# Patient Record
Sex: Male | Born: 1970 | Race: White | Hispanic: No | Marital: Married | Smoking: Never smoker
Health system: Southern US, Community
[De-identification: ages and names within clinical notes are randomized; demographics above are authoritative.]

## PROBLEM LIST (undated history)

## (undated) DIAGNOSIS — E782 Mixed hyperlipidemia: Secondary | ICD-10-CM

## (undated) DIAGNOSIS — H3320 Serous retinal detachment, unspecified eye: Secondary | ICD-10-CM

## (undated) DIAGNOSIS — I10 Essential (primary) hypertension: Secondary | ICD-10-CM

## (undated) DIAGNOSIS — H509 Unspecified strabismus: Secondary | ICD-10-CM

## (undated) DIAGNOSIS — E669 Obesity, unspecified: Secondary | ICD-10-CM

## (undated) DIAGNOSIS — L723 Sebaceous cyst: Secondary | ICD-10-CM

## (undated) DIAGNOSIS — R7402 Elevation of levels of lactic acid dehydrogenase (LDH): Secondary | ICD-10-CM

## (undated) DIAGNOSIS — R740 Nonspecific elevation of levels of transaminase and lactic acid dehydrogenase [LDH]: Secondary | ICD-10-CM

## (undated) HISTORY — DX: Nonspecific elevation of levels of transaminase and lactic acid dehydrogenase (ldh): R74.0

## (undated) HISTORY — DX: Unspecified strabismus: H50.9

## (undated) HISTORY — DX: Serous retinal detachment, unspecified eye: H33.20

## (undated) HISTORY — DX: Mixed hyperlipidemia: E78.2

## (undated) HISTORY — DX: Essential (primary) hypertension: I10

## (undated) HISTORY — PX: TONSILLECTOMY: SUR1361

## (undated) HISTORY — PX: OTHER SURGICAL HISTORY: SHX169

## (undated) HISTORY — DX: Elevation of levels of lactic acid dehydrogenase (LDH): R74.02

## (undated) HISTORY — PX: EYE SURGERY: SHX253

## (undated) HISTORY — DX: Obesity, unspecified: E66.9

## (undated) HISTORY — DX: Sebaceous cyst: L72.3

---

## 2014-07-30 ENCOUNTER — Ambulatory Visit (INDEPENDENT_AMBULATORY_CARE_PROVIDER_SITE_OTHER): Payer: BC Managed Care – PPO | Admitting: Unknown Physician Specialty

## 2014-07-30 ENCOUNTER — Encounter: Payer: Self-pay | Admitting: Unknown Physician Specialty

## 2014-07-30 VITALS — BP 135/87 | HR 78 | Temp 98.5°F | Ht 70.0 in | Wt 257.0 lb

## 2014-07-30 DIAGNOSIS — J018 Other acute sinusitis: Secondary | ICD-10-CM

## 2014-07-30 MED ORDER — AZITHROMYCIN 250 MG PO TABS: ORAL_TABLET | ORAL | Status: DC

## 2014-07-30 NOTE — Patient Instructions (Signed)
You have a sinus infection. Take medicine as prescribed:  Push fluids and plenty of rest. Nasal saline irrigation or neti pot to help drain sinuses. May use plain mucinex with plenty of fluid to help mobilize mucous. Please let us know if fever >101.5, trouble opening/closing mouth, difficulty swallowing, or worsening instead of improving as expected.

## 2014-07-30 NOTE — Progress Notes (Signed)
   BP 135/87 mmHg  Pulse 78  Temp(Src) 98.5 F (36.9 C) (Oral)  Ht 5\' 10"  (1.778 m)  Wt 257 lb (116.574 kg)  BMI 36.88 kg/m2  SpO2 97%   Subjective:    Patient ID: Derek Garrett, male    DOB: 06/03/1970, 44 y.o.   MRN: [id number]  HPI: Derek Garrett is a 44 y.o. male presenting on 07/30/2014 for Cough and Sinusitis      Relevant past medical, surgical, family and social history reviewed and updated as indicated. Interim medical history since our last visit reviewed. Allergies and medications reviewed and updated.  Cough This is a new problem. The current episode started 1 to 4 weeks ago. The problem has been unchanged. The problem occurs constantly. The cough is productive of purulent sputum. Associated symptoms include postnasal drip and rhinorrhea. Pertinent negatives include no shortness of breath. Nothing aggravates the symptoms. He has tried nothing for the symptoms. His past medical history is significant for asthma. There is no history of COPD.  Sinusitis Associated symptoms include coughing. Pertinent negatives include no shortness of breath.     Review of Systems  HENT: Positive for postnasal drip and rhinorrhea.   Respiratory: Positive for cough. Negative for shortness of breath.     Per HPI unless specifically indicated above     Objective:    BP 135/87 mmHg  Pulse 78  Temp(Src) 98.5 F (36.9 C) (Oral)  Ht 5\' 10"  (1.778 m)  Wt 257 lb (116.574 kg)  BMI 36.88 kg/m2  SpO2 97%  Wt Readings from Last 3 Encounters:  07/30/14 257 lb (116.574 kg)  03/07/14 265 lb (120.203 kg)  07/30/14 265 lb (120.203 kg)    Physical Exam  Constitutional: He is oriented to person, place, and time. He appears well-developed and well-nourished. No distress.  HENT:  Head: Normocephalic and atraumatic.  Right Ear: Tympanic membrane, external ear and ear canal normal.  Left Ear: Tympanic membrane, external ear and ear canal normal.  Nose: Mucosal edema and rhinorrhea present. No sinus  tenderness.  Mouth/Throat: Mucous membranes are normal. Posterior oropharyngeal erythema present. No oropharyngeal exudate or posterior oropharyngeal edema.  Eyes: Conjunctivae and lids are normal. Right eye exhibits no discharge. Left eye exhibits no discharge. No scleral icterus.  Cardiovascular: Normal rate and regular rhythm.   Pulmonary/Chest: Effort normal. No respiratory distress.  Abdominal: Normal appearance and bowel sounds are normal. He exhibits no distension. There is no splenomegaly or hepatomegaly. There is no tenderness.  Musculoskeletal: Normal range of motion.  Neurological: He is alert and oriented to person, place, and time.  Skin: Skin is intact. No rash noted. No pallor.  Psychiatric: He has a normal mood and affect. His behavior is normal. Judgment and thought content normal.       Assessment & Plan:   Problem List Items Addressed This Visit    None    Visit Diagnoses    Other acute sinusitis    -  Primary    Rx for Zithromax,  Pt ed on care.      Relevant Medications    azithromycin (ZITHROMAX Z-PAK) 250 MG tablet        Follow up plan: F/u prn

## 2014-09-12 ENCOUNTER — Ambulatory Visit (INDEPENDENT_AMBULATORY_CARE_PROVIDER_SITE_OTHER): Payer: BC Managed Care – PPO | Admitting: Family Medicine

## 2014-09-12 ENCOUNTER — Encounter: Payer: Self-pay | Admitting: Family Medicine

## 2014-09-12 VITALS — BP 145/93 | HR 71 | Temp 98.3°F | Wt 261.0 lb

## 2014-09-12 DIAGNOSIS — I10 Essential (primary) hypertension: Secondary | ICD-10-CM | POA: Insufficient documentation

## 2014-09-12 DIAGNOSIS — R748 Abnormal levels of other serum enzymes: Secondary | ICD-10-CM | POA: Diagnosis not present

## 2014-09-12 DIAGNOSIS — J0100 Acute maxillary sinusitis, unspecified: Secondary | ICD-10-CM

## 2014-09-12 DIAGNOSIS — E781 Pure hyperglyceridemia: Secondary | ICD-10-CM | POA: Insufficient documentation

## 2014-09-12 DIAGNOSIS — J320 Chronic maxillary sinusitis: Secondary | ICD-10-CM | POA: Insufficient documentation

## 2014-09-12 MED ORDER — AMOXICILLIN-POT CLAVULANATE 875-125 MG PO TABS: 1.0000 | ORAL_TABLET | Freq: Two times a day (BID) | ORAL | Status: DC

## 2014-09-12 NOTE — Assessment & Plan Note (Signed)
Lowfat diet, modest weight loss, recheck labs in about one month

## 2014-09-12 NOTE — Assessment & Plan Note (Signed)
Most likely fatty liver; reviewed with him the results from January and the SGPT is greater than the SGOT, so I don't think alcohol was the culprit; return in one month, modest weight loss, low fat diet

## 2014-09-12 NOTE — Patient Instructions (Addendum)
Okay to return for a lab visit in one month Less saturated fats Start the antibiotics Try vitamin C (orange juice if not diabetic or vitamin C tablets) and drink green tea to help your immune system during your illness Get plenty of rest and hydration Try to lose 5-10 pounds to help lower blood pressure as well  DASH Eating Plan DASH stands for "Dietary Approaches to Stop Hypertension." The DASH eating plan is a healthy eating plan that has been shown to reduce high blood pressure (hypertension). Additional health benefits may include reducing the risk of type 2 diabetes mellitus, heart disease, and stroke. The DASH eating plan may also help with weight loss. WHAT DO I NEED TO KNOW ABOUT THE DASH EATING PLAN? For the DASH eating plan, you will follow these general guidelines:  Choose foods with a percent daily value for sodium of less than 5% (as listed on the food label).  Use salt-free seasonings or herbs instead of table salt or sea salt.  Check with your health care provider or pharmacist before using salt substitutes.  Eat lower-sodium products, often labeled as "lower sodium" or "no salt added."  Eat fresh foods.  Eat more vegetables, fruits, and low-fat dairy products.  Choose whole grains. Look for the word "whole" as the first word in the ingredient list.  Choose fish and skinless chicken or [place] more often than red meat. Limit fish, poultry, and meat to 6 oz (170 g) each day.  Limit sweets, desserts, sugars, and sugary drinks.  Choose heart-healthy fats.  Limit cheese to 1 oz (28 g) per day.  Eat more home-cooked food and less restaurant, buffet, and fast food.  Limit fried foods.  Cook foods using methods other than frying.  Limit canned vegetables. If you do use them, rinse them well to decrease the sodium.  When eating at a restaurant, ask that your food be prepared with less salt, or no salt if possible. WHAT FOODS CAN I EAT? Seek help from a dietitian for  individual calorie needs. Grains Whole grain or whole wheat bread. Brown rice. Whole grain or whole wheat pasta. Quinoa, bulgur, and whole grain cereals. Low-sodium cereals. Corn or whole wheat flour tortillas. Whole grain cornbread. Whole grain crackers. Low-sodium crackers. Vegetables Fresh or frozen vegetables (raw, steamed, roasted, or grilled). Low-sodium or reduced-sodium tomato and vegetable juices. Low-sodium or reduced-sodium tomato sauce and paste. Low-sodium or reduced-sodium canned vegetables.  Fruits All fresh, canned (in natural juice), or frozen fruits. Meat and Other Protein Products Ground beef (85% or leaner), grass-fed beef, or beef trimmed of fat. Skinless chicken or [place]. Ground chicken or [place]. Pork trimmed of fat. All fish and seafood. Eggs. Dried beans, peas, or lentils. Unsalted nuts and seeds. Unsalted canned beans. Dairy Low-fat dairy products, such as skim or 1% milk, 2% or reduced-fat cheeses, low-fat ricotta or cottage cheese, or plain low-fat yogurt. Low-sodium or reduced-sodium cheeses. Fats and Oils Tub margarines without trans fats. Light or reduced-fat mayonnaise and salad dressings (reduced sodium). Avocado. Safflower, olive, or canola oils. Natural peanut or almond butter. Other Unsalted popcorn and pretzels. The items listed above may not be a complete list of recommended foods or beverages. Contact your dietitian for more options. WHAT FOODS ARE NOT RECOMMENDED? Grains White bread. White pasta. White rice. Refined cornbread. Bagels and croissants. Crackers that contain trans fat. Vegetables Creamed or fried vegetables. Vegetables in a cheese sauce. Regular canned vegetables. Regular canned tomato sauce and paste. Regular tomato and vegetable juices. Fruits  Dried fruits. Canned fruit in light or heavy syrup. Fruit juice. Meat and Other Protein Products Fatty cuts of meat. Ribs, chicken wings, bacon, sausage, bologna, salami, chitterlings, fatback, hot  dogs, bratwurst, and packaged luncheon meats. Salted nuts and seeds. Canned beans with salt. Dairy Whole or 2% milk, cream, half-and-half, and cream cheese. Whole-fat or sweetened yogurt. Full-fat cheeses or blue cheese. Nondairy creamers and whipped toppings. Processed cheese, cheese spreads, or cheese curds. Condiments Onion and garlic salt, seasoned salt, table salt, and sea salt. Canned and packaged gravies. Worcestershire sauce. Tartar sauce. Barbecue sauce. Teriyaki sauce. Soy sauce, including reduced sodium. Steak sauce. Fish sauce. Oyster sauce. Cocktail sauce. Horseradish. Ketchup and mustard. Meat flavorings and tenderizers. Bouillon cubes. Hot sauce. Tabasco sauce. Marinades. Taco seasonings. Relishes. Fats and Oils Butter, stick margarine, lard, shortening, ghee, and bacon fat. Coconut, palm kernel, or palm oils. Regular salad dressings. Other Pickles and olives. Salted popcorn and pretzels. The items listed above may not be a complete list of foods and beverages to avoid. Contact your dietitian for more information. WHERE CAN I FIND MORE INFORMATION? National Heart, Lung, and Blood Institute: [employer] Document Released: 01/29/2011 Document Revised: 06/26/2013 Document Reviewed: 12/14/2012 [facility] Patient Information 2015 [place], [place]. This information is not intended to replace advice given to you by your health care provider. Make sure you discuss any questions you have with your health care provider.

## 2014-09-12 NOTE — Assessment & Plan Note (Addendum)
Patient is not taking ACE-I any more; he would rather not use any medicine; he will try the DASH guidelines and try to lose a little more weight

## 2014-09-12 NOTE — Progress Notes (Signed)
BP 145/93 mmHg  Pulse 71  Temp(Src) 98.3 F (36.8 C)  Wt 261 lb (118.389 kg)  SpO2 99%   Subjective:    Patient ID: Derek Garrett, male    DOB: 1970/11/11, 44 y.o.   MRN: [id number]  HPI: Derek Garrett is a 44 y.o. male  Chief Complaint  Patient presents with  . Letter for School/Work    FMLA forms  . Sinus Problem  Patient is here for FMLA paperwork; however, I noted his BP was up He is usually under 140 [place], usually under 90 on the bottom; he has switched schedule but had to work a night shift last night; drank coffee and ate through the night; overall, BP has been better; had labs done through Dr. Dossie Arbour; today's reading is abnormal for him recently; not taking  Tylenol cold and sinus right now [facility] and was put on a Zpak couple of months ago; still having ear problems, nasal congestion; wakes up with a cough; has postnasal drainage; thought maybe allergies; having sinus / facial pressure; no dental pain, no fevers; drainage is yellow  Relevant past medical, surgical, family and social history reviewed and updated as indicated. Interim medical history since our last visit reviewed. Allergies and medications reviewed and updated.  Review of Systems Per HPI unless specifically indicated above     Objective:    BP 145/93 mmHg  Pulse 71  Temp(Src) 98.3 F (36.8 C)  Wt 261 lb (118.389 kg)  SpO2 99%  Wt Readings from Last 3 Encounters:  09/12/14 261 lb (118.389 kg)  07/30/14 257 lb (116.574 kg)  03/07/14 265 lb (120.203 kg)    Physical Exam  Constitutional: He appears well-developed and well-nourished. No distress.  HENT:  Right Ear: Hearing, tympanic membrane, external ear and ear canal normal.  Left Ear: Hearing, tympanic membrane, external ear and ear canal normal.  Nose: Rhinorrhea (yellowish rhinorrhea, right worse than left; mucosa is erythematous) present. No septal deviation. No epistaxis. Right sinus exhibits maxillary sinus tenderness and frontal sinus  tenderness. Left sinus exhibits frontal sinus tenderness. Left sinus exhibits no maxillary sinus tenderness.  Mouth/Throat: Oropharynx is clear and moist and mucous membranes are normal. No oropharyngeal exudate, posterior oropharyngeal edema or posterior oropharyngeal erythema.  Cardiovascular: Normal rate and regular rhythm.   Pulmonary/Chest: Effort normal and breath sounds normal.  Lymphadenopathy:    He has no cervical adenopathy.  Skin: Skin is warm and dry. He is not diaphoretic. No pallor.    No results found for this or any previous visit.    Assessment & Plan:   Problem List Items Addressed This Visit      Cardiovascular and Mediastinum   Benign hypertension - Primary    Patient is not taking ACE-I any more; he would rather not use any medicine; he will try the DASH guidelines and try to lose a little more weight        Respiratory   Maxillary sinusitis    Rest, hydration, vitamin C; start amox/clav for 10 days      Relevant Medications   amoxicillin-clavulanate (AUGMENTIN) 875-125 MG per tablet     Other   Elevated liver enzymes    Most likely fatty liver; reviewed with him the results from January and the SGPT is greater than the SGOT, so I don't think alcohol was the culprit; return in one month, modest weight loss, low fat diet      Relevant Orders   Comprehensive metabolic panel   Hypertriglyceridemia  Lowfat diet, modest weight loss, recheck labs in about one month      Relevant Orders   Lipid Panel w/o Chol/HDL Ratio      FMLA paperwork filled out for patient (son has autism)  Follow up plan: No Follow-up on file.  Orders Placed This Encounter  Procedures  . Lipid Panel w/o Chol/HDL Ratio  . Comprehensive metabolic panel   Meds ordered this encounter  Medications  . amoxicillin-clavulanate (AUGMENTIN) 875-125 MG per tablet    Sig: Take 1 tablet by mouth 2 (two) times daily.    Dispense:  20 tablet    Refill:  0

## 2014-09-12 NOTE — Assessment & Plan Note (Signed)
Rest, hydration, vitamin C; start amox/clav for 10 days

## 2014-10-16 ENCOUNTER — Other Ambulatory Visit: Payer: BC Managed Care – PPO

## 2015-01-15 ENCOUNTER — Telehealth: Payer: Self-pay

## 2015-01-15 NOTE — Telephone Encounter (Signed)
-----   Message from Elby ShowersAmy J Workman, [place] sent at 01/15/2015 11:29 AM EST ----- Regarding: FW: Please remind patient we would like fasting labs done soon Left message to call ----- Message -----    From: Kerman PasseyMelinda P Lada, MD    Sent: 01/12/2015   5:38 PM      To: Elby ShowersAmy J Workman, CMA Subject: Please remind patient we would like fasting #  This is usually best to get done BEFORE the holidays If he can come fasting Tuesday or Wednesday, that would be ideal If not, the week after Thanksgiving please

## 2015-01-15 NOTE — Telephone Encounter (Signed)
Patients wife notified

## 2015-03-13 ENCOUNTER — Encounter: Payer: Self-pay | Admitting: Family Medicine

## 2015-03-13 ENCOUNTER — Ambulatory Visit (INDEPENDENT_AMBULATORY_CARE_PROVIDER_SITE_OTHER): Payer: BC Managed Care – PPO | Admitting: Family Medicine

## 2015-03-13 VITALS — BP 142/86 | HR 80 | Temp 97.0°F | Ht 69.5 in | Wt 262.0 lb

## 2015-03-13 DIAGNOSIS — I10 Essential (primary) hypertension: Secondary | ICD-10-CM | POA: Diagnosis not present

## 2015-03-13 DIAGNOSIS — E781 Pure hyperglyceridemia: Secondary | ICD-10-CM

## 2015-03-13 DIAGNOSIS — R748 Abnormal levels of other serum enzymes: Secondary | ICD-10-CM

## 2015-03-13 DIAGNOSIS — L723 Sebaceous cyst: Secondary | ICD-10-CM

## 2015-03-13 NOTE — Patient Instructions (Addendum)
Try to limit saturated fats in your diet (bologna, hot dogs, barbeque, cheeseburgers, hamburgers, steak, bacon, sausage, cheese, etc.) and get more fresh fruits, vegetables, and whole grains Limit eggs to no more than three per week Work [employer] weight loss Check out the information at familydoctor.org entitled "What It Takes to Lose Weight" Try to lose between 1-2 pounds per week by taking in fewer calories and burning off more calories You can succeed by limiting portions, limiting foods dense in calories and fat, becoming more active, and drinking 8 glasses of water a day (64 ounces) Don't skip meals, especially breakfast, as skipping meals may alter your metabolism Do not use over-the-counter weight loss pills or gimmicks that claim rapid weight loss A healthy BMI (or body mass index) is between 18.5 and 24.9 You can calculate your ideal BMI at the NIH website [employer] You can have that place on your scalp removed if you desire

## 2015-03-13 NOTE — Assessment & Plan Note (Addendum)
Recheck today; limit alcohol to 14 drinks per week (call me if any trouble doing so); limit tylenol

## 2015-03-13 NOTE — Assessment & Plan Note (Addendum)
Check today (fasting); limiting fried foods, sweets; modest weight loss encouraged

## 2015-03-13 NOTE — Assessment & Plan Note (Signed)
He is welcome to return to have this removed

## 2015-03-13 NOTE — Progress Notes (Signed)
BP 142/86 mmHg  Pulse 80  Temp(Src) 97 F (36.1 C)  Ht 5' 9.5" (1.765 m)  Wt 262 lb (118.842 kg)  BMI 38.15 kg/m2  SpO2 95%   Subjective:    Patient ID: Derek Garrett, male    DOB: 1971-01-20, 45 y.o.   MRN: [id number]  HPI: Derek Garrett is a 45 y.o. male  Chief Complaint  Patient presents with  . FMLA forms    he needs his FMLA paperwork updated  . Hypertension    follow up  . Hyperlipidemia    follow up and labs   Patient is here for f/u of HTN, cholesterol, and needs forms filled out  He did come fasting today Never been on medicine for high cholesterol His father was on cholesterol medicine, not sure about mother, maybe so Father had bypass surgery Typical American eater in terms of meats; not much fried foods; more than three eggs a week; eats cheese; drinks skim milk  High blood pressure; mother had high blood pressure; he does check BP at work, usually below 140 on top, below 90 on the bottom; does not add salt to food much, just when cooking; not at the table; pays attention to the labels; has the DASH list and pays attention since getting that info; tries to limit sugar and salt intake; eats pasta smart and whole grains; he would like to control it without pills  He has hx of elevated liver enzymes; not much tylenol; usually ibuprofen for issues; does drink 2-3 beers a day  Flu shot UTD  Little bump; been there for years, right side scalp; doesn't drain  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 Interim medical history since our last visit reviewed. He drinks 2-3 beers per day Allergies and medications reviewed and updated.  Review of Systems  Respiratory: Negative for shortness of breath.   Cardiovascular: Negative for chest pain.   Per HPI unless specifically indicated above     Objective:    BP 142/86 mmHg  Pulse 80  Temp(Src) 97 F  (36.1 C)  Ht 5' 9.5" (1.765 m)  Wt 262 lb (118.842 kg)  BMI 38.15 kg/m2  SpO2 95%  Wt Readings from Last 3 Encounters:  03/13/15 262 lb (118.842 kg)  09/12/14 261 lb (118.389 kg)  07/30/14 257 lb (116.574 kg)    Physical Exam  Constitutional: He appears well-developed and well-nourished. No distress.  HENT:  Head: Normocephalic and atraumatic.  Eyes: EOM are normal. No scleral icterus.  Neck: No thyromegaly present.  Cardiovascular: Normal rate and regular rhythm.   Pulmonary/Chest: Effort normal and breath sounds normal.  Abdominal: Soft. Bowel sounds are normal. He exhibits no distension.  Musculoskeletal: He exhibits no edema.  Neurological: Coordination normal.  Skin: Skin is warm and dry. No pallor.  Psychiatric: He has a normal mood and affect. His behavior is normal. Judgment and thought content normal.    No results found for this or any previous visit.    Assessment & Plan:   Problem List Items Addressed This Visit      Cardiovascular and Mediastinum   Benign hypertension - Primary    Watch DASH guidelines, limit salt, limit alcohol (no more than 2 per day, call me if trouble cutting back); modest weight loss; monitor and let me know if over 140/90; he does not want medicine at this time  Musculoskeletal and Integument   Sebaceous cyst    He is welcome to return to have this removed        Other   Elevated liver enzymes    Recheck today; limit alcohol to 14 drinks per week (call me if any trouble doing so); limit tylenol      Relevant Orders   Comprehensive metabolic panel   Hypertriglyceridemia    Check today (fasting); limiting fried foods, sweets; modest weight loss encouraged      Relevant Orders   Lipid Panel w/o Chol/HDL Ratio      Follow up plan: Return in about 6 months (around 09/10/2015) for thirty minute follow-up with fasting labs.  Orders Placed This Encounter  Procedures  . Lipid Panel w/o Chol/HDL Ratio  . Comprehensive  metabolic panel   An after-visit summary was printed and given to the patient at check-out.  Please see the patient instructions which may contain other information and recommendations beyond what is mentioned above in the assessment and plan.  FMLA form completed, see copy on the chart

## 2015-03-13 NOTE — Assessment & Plan Note (Addendum)
Watch DASH guidelines, limit salt, limit alcohol (no more than 2 per day, call me if trouble cutting back); modest weight loss; monitor and let me know if over 140/90; he does not want medicine at this time

## 2015-03-14 LAB — COMPREHENSIVE METABOLIC PANEL
ALBUMIN: 4.6 g/dL (ref 3.5–5.5)
ALT: 79 IU/L — ABNORMAL HIGH (ref 0–44)
AST: 43 IU/L — ABNORMAL HIGH (ref 0–40)
Albumin/Globulin Ratio: 2.1 (ref 1.1–2.5)
Alkaline Phosphatase: 50 IU/L (ref 39–117)
BUN / CREAT RATIO: 16 (ref 9–20)
BUN: 14 mg/dL (ref 6–24)
Bilirubin Total: 0.6 mg/dL (ref 0.0–1.2)
CO2: 23 mmol/L (ref 18–29)
CREATININE: 0.89 mg/dL (ref 0.76–1.27)
Calcium: 9.6 mg/dL (ref 8.7–10.2)
Chloride: 99 mmol/L (ref 96–106)
GFR, EST AFRICAN AMERICAN: 119 mL/min/{1.73_m2} (ref >59)
GFR, EST NON AFRICAN AMERICAN: 103 mL/min/{1.73_m2} (ref >59)
GLOBULIN, TOTAL: 2.2 g/dL (ref 1.5–4.5)
GLUCOSE: 97 mg/dL (ref 65–99)
Potassium: 4.4 mmol/L (ref 3.5–5.2)
SODIUM: 140 mmol/L (ref 134–144)
Total Protein: 6.8 g/dL (ref 6.0–8.5)

## 2015-03-14 LAB — LIPID PANEL W/O CHOL/HDL RATIO
Cholesterol, Total: 177 mg/dL (ref 100–199)
HDL: 40 mg/dL (ref >39)
LDL CALC: 100 mg/dL — AB (ref 0–99)
TRIGLYCERIDES: 186 mg/dL — AB (ref 0–149)
VLDL Cholesterol Cal: 37 mg/dL (ref 5–40)

## 2015-03-19 ENCOUNTER — Telehealth: Payer: Self-pay | Admitting: Family Medicine

## 2015-03-19 DIAGNOSIS — R748 Abnormal levels of other serum enzymes: Secondary | ICD-10-CM

## 2015-03-19 NOTE — Telephone Encounter (Signed)
That was done the day he was here; I'll call because there must be a question about something, but it was finished while he was here in the office I'll also call about labs; lipid panel, elevated SGOT and SGPT AST 0 - 40 IU/L 43 (H)   ALT 0 - 44 IU/L 79 (H)

## 2015-03-19 NOTE — Telephone Encounter (Signed)
Pt would like a call back in regards to his fmla paperwork.

## 2015-03-20 NOTE — Assessment & Plan Note (Signed)
Will get liver US; try weight loss, low-fat diet, decrease EtOH; recheck LFTs in 3 months

## 2015-03-20 NOTE — Telephone Encounter (Signed)
Spoke with patient, forms are ready (he added something and I just needed to initial it) Explained lipid panel; elevated LFTs, likely fatty liver; will get [place], try low-fat diet, weight loss, reducing EtOH, recheck liver enzymes in 3 months; he agrees Monday through Thursday, mornings are best for [place]

## 2015-04-01 ENCOUNTER — Ambulatory Visit
Admission: RE | Admit: 2015-04-01 | Discharge: 2015-04-01 | Disposition: A | Discharge status: Home / Self Care | Payer: BC Managed Care – PPO | Source: Ambulatory Visit | Attending: Family Medicine | Admitting: Family Medicine

## 2015-04-01 DIAGNOSIS — K760 Fatty (change of) liver, not elsewhere classified: Secondary | ICD-10-CM | POA: Insufficient documentation

## 2015-04-01 DIAGNOSIS — R748 Abnormal levels of other serum enzymes: Secondary | ICD-10-CM | POA: Diagnosis not present

## 2015-04-02 ENCOUNTER — Telehealth: Payer: Self-pay | Admitting: Family Medicine

## 2015-04-02 DIAGNOSIS — K760 Fatty (change of) liver, not elsewhere classified: Secondary | ICD-10-CM | POA: Insufficient documentation

## 2015-04-02 NOTE — Telephone Encounter (Signed)
I spoke with patient; explained fatty liver; work on weight loss, low fat diet; recheck labs in a few months; he can consider milk thistle

## 2015-05-14 ENCOUNTER — Telehealth: Payer: Self-pay | Admitting: Family Medicine

## 2015-05-14 MED ORDER — OSELTAMIVIR PHOSPHATE 75 MG PO CAPS: 75.0000 mg | ORAL_CAPSULE | Freq: Every day | ORAL | Status: DC

## 2015-05-14 NOTE — Telephone Encounter (Signed)
Sons tested positive for influenza B. Needs prophylactic medicine. Rx sent to his pharmacy.

## 2015-09-10 ENCOUNTER — Ambulatory Visit: Payer: BC Managed Care – PPO | Admitting: Family Medicine

## 2015-09-24 ENCOUNTER — Ambulatory Visit (INDEPENDENT_AMBULATORY_CARE_PROVIDER_SITE_OTHER): Payer: BC Managed Care – PPO | Admitting: Family Medicine

## 2015-09-24 ENCOUNTER — Encounter: Payer: Self-pay | Admitting: Family Medicine

## 2015-09-24 DIAGNOSIS — E781 Pure hyperglyceridemia: Secondary | ICD-10-CM | POA: Diagnosis not present

## 2015-09-24 DIAGNOSIS — K760 Fatty (change of) liver, not elsewhere classified: Secondary | ICD-10-CM | POA: Diagnosis not present

## 2015-09-24 LAB — COMPREHENSIVE METABOLIC PANEL
ALT: 37 U/L (ref 9–46)
AST: 30 U/L (ref 10–40)
Albumin: 4.5 g/dL (ref 3.6–5.1)
Alkaline Phosphatase: 49 U/L (ref 40–115)
BILIRUBIN TOTAL: 0.7 mg/dL (ref 0.2–1.2)
BUN: 14 mg/dL (ref 7–25)
CO2: 27 mmol/L (ref 20–31)
CREATININE: 0.96 mg/dL (ref 0.60–1.35)
Calcium: 9.5 mg/dL (ref 8.6–10.3)
Chloride: 102 mmol/L (ref 98–110)
GLUCOSE: 96 mg/dL (ref 65–99)
Potassium: 4.7 mmol/L (ref 3.5–5.3)
SODIUM: 138 mmol/L (ref 135–146)
Total Protein: 7.3 g/dL (ref 6.1–8.1)

## 2015-09-24 LAB — LIPID PANEL
Cholesterol: 156 mg/dL (ref 125–200)
HDL: 48 mg/dL (ref >=40)
LDL CALC: 86 mg/dL (ref <130)
Total CHOL/HDL Ratio: 3.3 Ratio (ref <=5.0)
Triglycerides: 109 mg/dL (ref <150)
VLDL: 22 mg/dL (ref <30)

## 2015-09-24 NOTE — Progress Notes (Signed)
BP 124/84   Pulse 73   Temp 98.3 F (36.8 C) (Oral)   Resp 16   Wt 232 lb (105.2 kg)   SpO2 97%   BMI 33.77 kg/m    Subjective:    Patient ID: Derek Garrett, male    DOB: 1970-09-24, 45 y.o.   MRN: [id number]  HPI: Derek Garrett is a 45 y.o. male  Chief Complaint  Patient presents with  . Follow-up    He has been working hard at losing weight; exercising and eating better; totally cut out beef and pork; lots of fish and chicken and veggies; lots of veggies in the sauce  He is exercising; no chest pain; not taking aspirin, just occasional advil or tylenol for aches and pains  Fatty liver, elevated LFTs previously; again, he has been working hard to lose weight and eat better  Depression screen PHQ 2/9 09/24/2015  Decreased Interest 0  Down, Depressed, Hopeless 0  PHQ - 2 Score 0   Relevant past medical, surgical, family and social history reviewed Past Medical History:  Diagnosis Date  . Detached retina    Right   . Elevated levels of transaminase & lactic acid dehydrogenase   . Elevated triglycerides with high cholesterol   . Hypertension   . Obesity   . Sebaceous cyst    Scalp  . Strabismus    Right Traumtic strabismus with IOL    Past Surgical History:  Procedure Laterality Date  . cataract surgery    . EYE SURGERY     Right eye retina repair, replaced lens   . TONSILLECTOMY     Family History  Problem Relation Age of Onset  . Hypertension Mother   . Hyperlipidemia Mother   . Stroke Mother   . CAD Father   . Hypercholesterolemia Father   . Heart disease Father   . Diabetes Sister    Social History  Substance Use Topics  . Smoking status: Never Smoker  . Smokeless tobacco: Never Used  . Alcohol use 0.0 oz/week   Interim medical history since last visit reviewed. Allergies and medications reviewed  Review of Systems Per HPI unless specifically indicated above     Objective:    BP 124/84   Pulse 73   Temp 98.3 F (36.8 C) (Oral)   Resp 16    Wt 232 lb (105.2 kg)   SpO2 97%   BMI 33.77 kg/m   Wt Readings from Last 3 Encounters:  09/24/15 232 lb (105.2 kg)  03/13/15 262 lb (118.8 kg)  09/12/14 261 lb (118.4 kg)    Physical Exam  Constitutional: He appears well-developed and well-nourished. No distress.  Weight loss of 30 pounds over last 5-1/2 months  Eyes: No scleral icterus.  Cardiovascular: Normal rate and regular rhythm.   Pulmonary/Chest: Effort normal and breath sounds normal.  Abdominal: Soft. Bowel sounds are normal. He exhibits no distension.  Musculoskeletal: He exhibits no edema.  Neurological: He is alert.  Skin: Skin is warm and dry. No pallor.  Psychiatric: He has a normal mood and affect. His behavior is normal. Judgment and thought content normal.   Results for orders placed or performed in visit on 03/13/15  Lipid Panel w/o Chol/HDL Ratio  Result Value Ref Range   Cholesterol, Total 177 100 - 199 mg/dL   Triglycerides 416 (H) 0 - 149 mg/dL   HDL 40 >38 mg/dL   VLDL Cholesterol Cal 37 5 - 40 mg/dL   LDL Calculated 453 (H) 0 -  99 mg/dL  Comprehensive metabolic panel  Result Value Ref Range   Glucose 97 65 - 99 mg/dL   BUN 14 6 - 24 mg/dL   Creatinine, Ser 1.61 0.76 - 1.27 mg/dL   GFR calc non Af Amer 103 >59 mL/min/1.73   GFR calc Af Amer 119 >59 mL/min/1.73   BUN/Creatinine Ratio 16 9 - 20   Sodium 140 134 - 144 mmol/L   Potassium 4.4 3.5 - 5.2 mmol/L   Chloride 99 96 - 106 mmol/L   CO2 23 18 - 29 mmol/L   Calcium 9.6 8.7 - 10.2 mg/dL   Total Protein 6.8 6.0 - 8.5 g/dL   Albumin 4.6 3.5 - 5.5 g/dL   Globulin, Total 2.2 1.5 - 4.5 g/dL   Albumin/Globulin Ratio 2.1 1.1 - 2.5   Bilirubin Total 0.6 0.0 - 1.2 mg/dL   Alkaline Phosphatase 50 39 - 117 IU/L   AST 43 (H) 0 - 40 IU/L   ALT 79 (H) 0 - 44 IU/L      Assessment & Plan:   Problem List Items Addressed This Visit      Digestive   Fatty liver    I cannot wait to see labs results because I am confident that these numbers will be better  after his hard fought battle with weight loss      Relevant Orders   Comprehensive Metabolic Panel (CMET)     Other   Hypertriglyceridemia    Amazing success with weight loss; check labs and expect improvement      Relevant Orders   Lipid panel    Other Visit Diagnoses   None.     Follow up plan: Return in about 1 year (around 09/23/2016) for complete physical.  An after-visit summary was printed and given to the patient at check-out.  Please see the patient instructions which may contain other information and recommendations beyond what is mentioned above in the assessment and plan.  No orders of the defined types were placed in this encounter.   Orders Placed This Encounter  Procedures  . Comprehensive Metabolic Panel (CMET)  . Lipid panel

## 2015-09-24 NOTE — Assessment & Plan Note (Signed)
I cannot wait to see labs results because I am confident that these numbers will be better after his hard fought battle with weight loss

## 2015-09-24 NOTE — Assessment & Plan Note (Signed)
Amazing success with weight loss; check labs and expect improvement

## 2015-09-24 NOTE — Patient Instructions (Signed)
I am so proud of the changes you have made with exercise and eating better and weight loss We'll contact you about the labs

## 2015-11-21 ENCOUNTER — Ambulatory Visit (INDEPENDENT_AMBULATORY_CARE_PROVIDER_SITE_OTHER): Payer: BC Managed Care – PPO

## 2015-11-21 DIAGNOSIS — Z23 Encounter for immunization: Secondary | ICD-10-CM | POA: Diagnosis not present

## 2016-04-21 ENCOUNTER — Other Ambulatory Visit: Payer: Self-pay | Admitting: Student

## 2016-04-21 DIAGNOSIS — M25512 Pain in left shoulder: Principal | ICD-10-CM

## 2016-04-21 DIAGNOSIS — M19012 Primary osteoarthritis, left shoulder: Secondary | ICD-10-CM

## 2016-04-21 DIAGNOSIS — G8929 Other chronic pain: Secondary | ICD-10-CM

## 2016-05-04 ENCOUNTER — Ambulatory Visit: Payer: BLUE CROSS/BLUE SHIELD

## 2016-09-25 ENCOUNTER — Encounter: Payer: BC Managed Care – PPO | Admitting: Family Medicine

## 2016-10-16 ENCOUNTER — Encounter: Payer: Self-pay | Admitting: Family Medicine

## 2016-10-16 ENCOUNTER — Ambulatory Visit (INDEPENDENT_AMBULATORY_CARE_PROVIDER_SITE_OTHER): Payer: BLUE CROSS/BLUE SHIELD | Admitting: Family Medicine

## 2016-10-16 DIAGNOSIS — Z0000 Encounter for general adult medical examination without abnormal findings: Secondary | ICD-10-CM | POA: Diagnosis not present

## 2016-10-16 LAB — CBC WITH DIFFERENTIAL/PLATELET
BASOS ABS: 62 {cells}/uL (ref 0–200)
Basophils Relative: 1 %
EOS ABS: 186 {cells}/uL (ref 15–500)
EOS PCT: 3 %
HCT: 48.5 % (ref 38.5–50.0)
Hemoglobin: 16.9 g/dL (ref 13.2–17.1)
LYMPHS PCT: 19 %
Lymphs Abs: 1178 cells/uL (ref 850–3900)
MCH: 32.9 pg (ref 27.0–33.0)
MCHC: 34.8 g/dL (ref 32.0–36.0)
MCV: 94.4 fL (ref 80.0–100.0)
MONOS PCT: 8 %
MPV: 9.9 fL (ref 7.5–12.5)
Monocytes Absolute: 496 cells/uL (ref 200–950)
NEUTROS PCT: 69 %
Neutro Abs: 4278 cells/uL (ref 1500–7800)
PLATELETS: 247 10*3/uL (ref 140–400)
RBC: 5.14 MIL/uL (ref 4.20–5.80)
RDW: 12.9 % (ref 11.0–15.0)
WBC: 6.2 10*3/uL (ref 3.8–10.8)

## 2016-10-16 NOTE — Progress Notes (Signed)
Patient ID: Derek Garrett, male   DOB: 06-06-1970, 46 y.o.   MRN: [id number]   Subjective:   Derek Garrett is a 46 y.o. 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; kept the boys safe, minimized sharing of food; totally went away on Monday (today is Friday); no travel; he also had a flap lesion left middle finger about a month ago; [place]head; clean; no red streaks; cleaned it well and has used bandaids on it since  USPSTF grade A and B recommendations Depression:  Depression screen [facility] 2/9 10/16/2016 09/24/2015  Decreased Interest 0 0  Down, Depressed, Hopeless 0 0  PHQ - 2 Score 0 0   Hypertension: BP Readings from Last 3 Encounters:  10/16/16 126/76  09/24/15 124/84  03/13/15 (!) 142/86   Obesity: Wt Readings from Last 3 Encounters:  10/16/16 229 lb 11.2 oz (104.2 kg)  09/24/15 232 lb (105.2 kg)  03/13/15 262 lb (118.8 kg)   BMI Readings from Last 3 Encounters:  10/16/16 33.56 kg/m  09/24/15 33.77 kg/m  03/13/15 38.14 kg/m    Alcohol: 12 drinks a week Tobacco use: n/a HIV, hep B, hep C: not interested Married STD testing and prevention (chl/gon/syphilis): not interested Lipids: fasting Lab Results  Component Value Date   CHOL 156 09/24/2015   CHOL 177 03/13/2015   Lab Results  Component Value Date   HDL 48 09/24/2015   HDL 40 03/13/2015   Lab Results  Component Value Date   LDLCALC 86 09/24/2015   LDLCALC 100 (H) 03/13/2015   Lab Results  Component Value Date   TRIG 109 09/24/2015   TRIG 186 (H) 03/13/2015   Lab Results  Component Value Date   CHOLHDL 3.3 09/24/2015   No results found for: LDLDIRECT Glucose: fasting; sister has diabetes Glucose  Date Value Ref Range Status  03/13/2015 97 65 - 99 mg/dL Final   Glucose, Bld   Date Value Ref Range Status  09/24/2015 96 65 - 99 mg/dL Final   Colorectal cancer: no first degree with colon cancer; no hx; start at age 34 Prostate cancer: start at age 72, no hx and no sx No results found for: PSA Lung cancer:  n/a AAA: n/a Aspirin: n/a Diet: balanced diet Exercise: pretty active Skin cancer: no worrisome moles  Past Medical History:  Diagnosis Date  . Detached retina    Right   . Elevated levels of transaminase & lactic acid dehydrogenase   . Elevated triglycerides with high cholesterol   . Hypertension   . Obesity   . Sebaceous cyst    Scalp  . Strabismus    Right Traumtic strabismus with IOL    Past Surgical History:  Procedure Laterality Date  . cataract surgery    . EYE SURGERY     Right eye retina repair, replaced lens   . TONSILLECTOMY     Family History  Problem Relation Age of Onset  . Hypertension Mother   . Hyperlipidemia Mother   . Stroke Mother   . CAD Father   . Hypercholesterolemia Father   . Heart disease Father   . Diabetes Sister   . Hyperlipidemia Brother   . Hypertension Brother   . Cancer Maternal Grandmother        unknown  .  Heart attack Maternal Grandfather   . Heart attack Paternal Grandfather   . Seizures Sister    Social History  Substance Use Topics  . Smoking status: Never Smoker  . Smokeless tobacco: Never Used  . Alcohol use 0.0 oz/week   Review of Systems  Constitutional: Negative for unexpected weight change.  Respiratory: Negative for shortness of breath.   Cardiovascular: Negative for chest pain.  Endocrine: Negative for polydipsia.  Genitourinary: Negative for decreased urine volume.  Allergic/Immunologic: Negative for food allergies.  Neurological: Negative for tremors.  Psychiatric/Behavioral: Negative for dysphoric mood.   Objective:   Vitals:   10/16/16 1101  BP: 126/76  Pulse: 87  Resp: 16  Temp: 98.3 F (36.8 C)  TempSrc: Oral  SpO2: 98%  Weight: 229 lb 11.2 oz (104.2 kg)   Height: 5' 9.38" (1.762 m)   Body mass index is 33.56 kg/m. Wt Readings from Last 3 Encounters:  10/16/16 229 lb 11.2 oz (104.2 kg)  09/24/15 232 lb (105.2 kg)  03/13/15 262 lb (118.8 kg)   Physical Exam  Constitutional: He appears well-developed and well-nourished. No distress.  HENT:  Head: Normocephalic and atraumatic.  Nose: Nose normal.  Mouth/Throat: Oropharynx is clear and moist.  Eyes: EOM are normal. No scleral icterus.  Neck: No JVD present. No thyromegaly present.  Cardiovascular: Normal rate, regular rhythm and normal heart sounds.   Pulmonary/Chest: Effort normal and breath sounds normal. No respiratory distress. He has no wheezes. He has no rales.  Abdominal: Soft. Bowel sounds are normal. He exhibits no distension. There is no tenderness. There is no guarding.  Musculoskeletal: Normal range of motion. He exhibits no edema.  Lymphadenopathy:    He has no cervical adenopathy.  Neurological: He is alert. He displays normal reflexes. He exhibits normal muscle tone. Coordination normal.  Skin: Skin is warm and dry. No rash noted. He is not diaphoretic. No erythema. No pallor.  Healing flap lesion/laceration pad of distal middle finger left hand; no proximal erythematous streaks  Psychiatric: He has a normal mood and affect. His behavior is normal. Judgment and thought content normal. His mood appears not anxious. He does not exhibit a depressed mood.    Assessment/Plan:   Problem List Items Addressed This Visit      Other   Preventative health care    USPSTF grade A and B recommendations reviewed with patient; age-appropriate recommendations, preventive care, screening tests, etc discussed and encouraged; healthy living encouraged; see AVS for patient education given to patient       Relevant Orders   CBC with Differential/Platelet   COMPLETE METABOLIC PANEL WITH GFR   Lipid panel   TSH      Meds ordered this encounter  Medications  . ibuprofen  (ADVIL,MOTRIN) 200 MG tablet    Sig: Take 200 mg by mouth every 6 (six) hours as needed.  . Multiple Vitamin (MULTI-VITAMINS) TABS    Sig: Take 100 mg by mouth daily.   Orders Placed This Encounter  Procedures  . CBC with Differential/Platelet  . COMPLETE METABOLIC PANEL WITH GFR  . Lipid panel  . TSH    Follow up plan: Return in about 1 year (around 10/16/2017) for complete physical.  An After Visit Summary was printed and given to the patient.

## 2016-10-16 NOTE — Patient Instructions (Addendum)
We'll get labs today If you have not heard anything from my staff in a week about any orders/referrals/studies from today, please contact us here to follow-up [phone]   Health Maintenance, Male A healthy lifestyle and preventive care is important for your health and wellness. Ask your health care provider about what schedule of regular examinations is right for you. What should I know about weight and diet? Eat a Healthy Diet  Eat plenty of vegetables, fruits, whole grains, low-fat dairy products, and lean protein.  Do not eat a lot of foods high in solid fats, added sugars, or salt.  Maintain a Healthy Weight Regular exercise can help you achieve or maintain a healthy weight. You should:  Do at least 150 minutes of exercise each week. The exercise should increase your heart rate and make you sweat (moderate-intensity exercise).  Do strength-training exercises at least twice a week.  Watch Your Levels of Cholesterol and Blood Lipids  Have your blood tested for lipids and cholesterol every 5 years starting at 47 years of age. If you are at high risk for heart disease, you should start having your blood tested when you are 47 years old. You may need to have your cholesterol levels checked more often if: ? Your lipid or cholesterol levels are high. ? You are older than 47 years of age. ? You are at high risk for heart disease.  What should I know about cancer screening? Many types of cancers can be detected early and may often be prevented. Lung Cancer  You should be screened every year for lung cancer if: ? You are a current smoker who has smoked for at least 30 years. ? You are a former smoker who has quit within the past 15 years.  Talk to your health care provider about your screening options, when you should start screening, and how often you should be screened.  Colorectal Cancer  Routine colorectal cancer screening usually begins at 47 years of age and should be  repeated every 5-10 years until you are 47 years old. You may need to be screened more often if early forms of precancerous polyps or small growths are found. Your health care provider may recommend screening at an earlier age if you have risk factors for colon cancer.  Your health care provider may recommend using home test kits to check for hidden blood in the stool.  A small camera at the end of a tube can be used to examine your colon (sigmoidoscopy or colonoscopy). This checks for the earliest forms of colorectal cancer.  Prostate and Testicular Cancer  Depending on your age and overall health, your health care provider may do certain tests to screen for prostate and testicular cancer.  Talk to your health care provider about any symptoms or concerns you have about testicular or prostate cancer.  Skin Cancer  Check your skin from head to toe regularly.  Tell your health care provider about any new moles or changes in moles, especially if: ? There is a change in a mole's size, shape, or color. ? You have a mole that is larger than a pencil eraser.  Always use sunscreen. Apply sunscreen liberally and repeat throughout the day.  Protect yourself by wearing long sleeves, pants, a wide-brimmed hat, and sunglasses when outside.  What should I know about heart disease, diabetes, and high blood pressure?  If you are 18-39 years of age, have your blood pressure checked every 3-5 years. If you are 40   years of age or older, have your blood pressure checked every year. You should have your blood pressure measured twice-once when you are at a hospital or clinic, and once when you are not at a hospital or clinic. Record the average of the two measurements. To check your blood pressure when you are not at a hospital or clinic, you can use: ? An automated blood pressure machine at a pharmacy. ? A home blood pressure monitor.  Talk to your health care provider about your target blood  pressure.  If you are between 45-79 years old, ask your health care provider if you should take aspirin to prevent heart disease.  Have regular diabetes screenings by checking your fasting blood sugar level. ? If you are at a normal weight and have a low risk for diabetes, have this test once every three years after the age of 45. ? If you are overweight and have a high risk for diabetes, consider being tested at a younger age or more often.  A one-time screening for abdominal aortic aneurysm (AAA) by ultrasound is recommended for men aged 65-75 years who are current or former smokers. What should I know about preventing infection? Hepatitis B If you have a higher risk for hepatitis B, you should be screened for this virus. Talk with your health care provider to find out if you are at risk for hepatitis B infection. Hepatitis C Blood testing is recommended for:  Everyone born from 1945 through 1965.  Anyone with known risk factors for hepatitis C.  Sexually Transmitted Diseases (STDs)  You should be screened each year for STDs including gonorrhea and chlamydia if: ? You are sexually active and are younger than 47 years of age. ? You are older than 47 years of age and your health care provider tells you that you are at risk for this type of infection. ? Your sexual activity has changed since you were last screened and you are at an increased risk for chlamydia or gonorrhea. Ask your health care provider if you are at risk.  Talk with your health care provider about whether you are at high risk of being infected with HIV. Your health care provider may recommend a prescription medicine to help prevent HIV infection.  What else can I do?  Schedule regular health, dental, and eye exams.  Stay current with your vaccines (immunizations).  Do not use any tobacco products, such as cigarettes, chewing tobacco, and e-cigarettes. If you need help quitting, ask your health care  provider.  Limit alcohol intake to no more than 2 drinks per day. One drink equals 12 ounces of beer, 5 ounces of wine, or 1 ounces of hard liquor.  Do not use street drugs.  Do not share needles.  Ask your health care provider for help if you need support or information about quitting drugs.  Tell your health care provider if you often feel depressed.  Tell your health care provider if you have ever been abused or do not feel safe at home. This information is not intended to replace advice given to you by your health care provider. Make sure you discuss any questions you have with your health care provider. Document Released: 08/08/2007 Document Revised: 10/09/2015 Document Reviewed: 11/13/2014 Elsevier Interactive Patient Education  2018 [employer]  

## 2016-10-16 NOTE — Assessment & Plan Note (Signed)
USPSTF grade A and B recommendations reviewed with patient; age-appropriate recommendations, preventive care, screening tests, etc discussed and encouraged; healthy living encouraged; see AVS for patient education given to patient  

## 2016-10-17 LAB — LIPID PANEL
Cholesterol: 166 mg/dL (ref <200)
HDL: 40 mg/dL — AB (ref >40)
LDL Cholesterol: 85 mg/dL (ref <100)
TRIGLYCERIDES: 203 mg/dL — AB (ref <150)
Total CHOL/HDL Ratio: 4.2 Ratio (ref <5.0)
VLDL: 41 mg/dL — ABNORMAL HIGH (ref <30)

## 2016-10-17 LAB — COMPLETE METABOLIC PANEL WITH GFR
ALT: 59 U/L — ABNORMAL HIGH (ref 9–46)
AST: 62 U/L — ABNORMAL HIGH (ref 10–40)
Albumin: 4.8 g/dL (ref 3.6–5.1)
Alkaline Phosphatase: 46 U/L (ref 40–115)
BILIRUBIN TOTAL: 0.7 mg/dL (ref 0.2–1.2)
BUN: 15 mg/dL (ref 7–25)
CO2: 24 mmol/L (ref 20–32)
Calcium: 9.9 mg/dL (ref 8.6–10.3)
Chloride: 100 mmol/L (ref 98–110)
Creat: 0.84 mg/dL (ref 0.60–1.35)
GFR, Est African American: >89 mL/min (ref >=60)
Glucose, Bld: 92 mg/dL (ref 65–99)
POTASSIUM: 4.8 mmol/L (ref 3.5–5.3)
Sodium: 138 mmol/L (ref 135–146)
TOTAL PROTEIN: 7.5 g/dL (ref 6.1–8.1)

## 2016-10-17 LAB — TSH: TSH: 1.2 mIU/L (ref 0.40–4.50)

## 2016-10-21 ENCOUNTER — Other Ambulatory Visit: Payer: Self-pay | Admitting: Family Medicine

## 2016-10-21 DIAGNOSIS — K760 Fatty (change of) liver, not elsewhere classified: Secondary | ICD-10-CM

## 2016-10-21 DIAGNOSIS — R748 Abnormal levels of other serum enzymes: Secondary | ICD-10-CM

## 2016-10-21 NOTE — Progress Notes (Signed)
Future orders for LFTs

## 2016-11-30 ENCOUNTER — Ambulatory Visit (INDEPENDENT_AMBULATORY_CARE_PROVIDER_SITE_OTHER): Payer: BLUE CROSS/BLUE SHIELD

## 2016-11-30 DIAGNOSIS — Z23 Encounter for immunization: Secondary | ICD-10-CM | POA: Diagnosis not present

## 2017-10-18 ENCOUNTER — Encounter: Payer: BLUE CROSS/BLUE SHIELD | Admitting: Family Medicine

## 2017-10-18 ENCOUNTER — Encounter: Payer: BLUE CROSS/BLUE SHIELD | Admitting: Nurse Practitioner

## 2017-10-30 IMAGING — US US ABDOMEN LIMITED
1 series · 14 of 25 positions shown · non-contrast
Comparison: None in PACs

CLINICAL DATA: Elevated liver enzymes, clinical suspicion of fatty
liver.

EXAM:
US ABDOMEN LIMITED - RIGHT UPPER QUADRANT

[Series 1: us abdomen limited · 0.25mm/px · 14 of 52 slices shown]
[im 1/52]
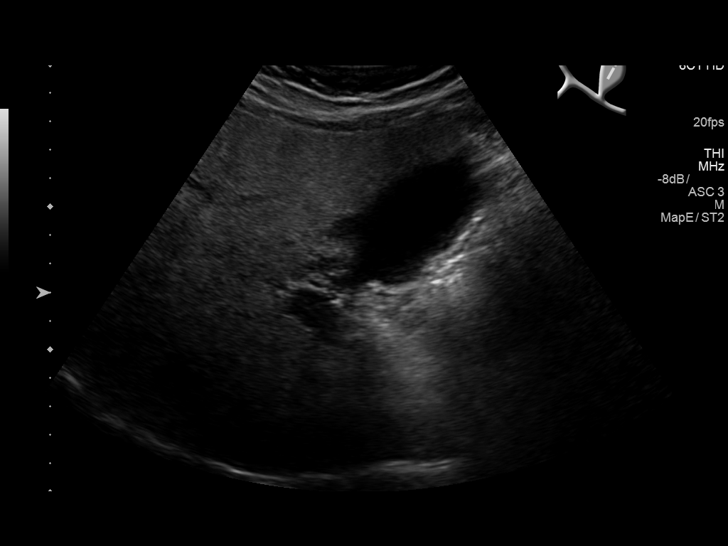
[im 5/52]
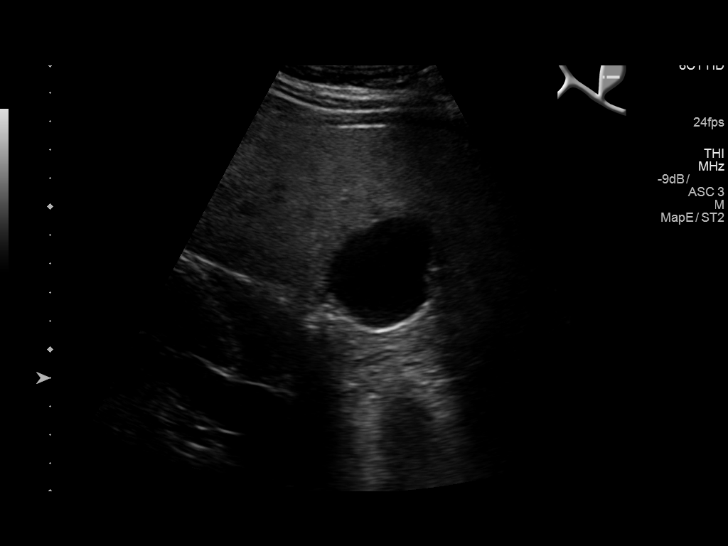
[im 9/52]
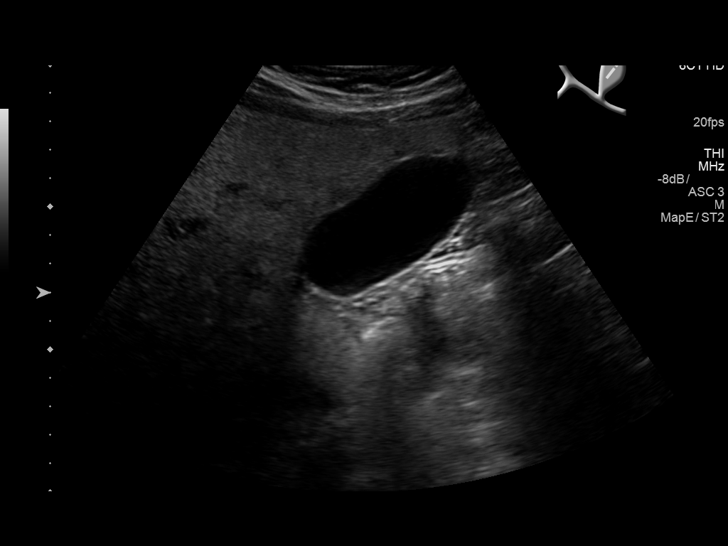
[im 13/52]
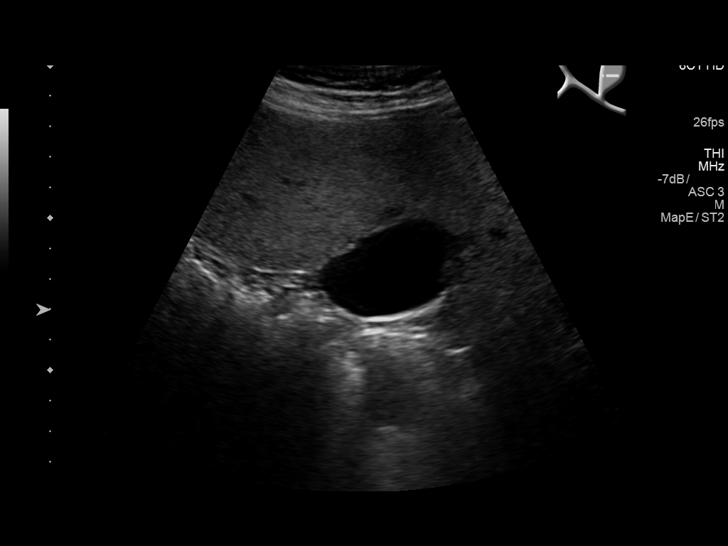
[im 18/52]
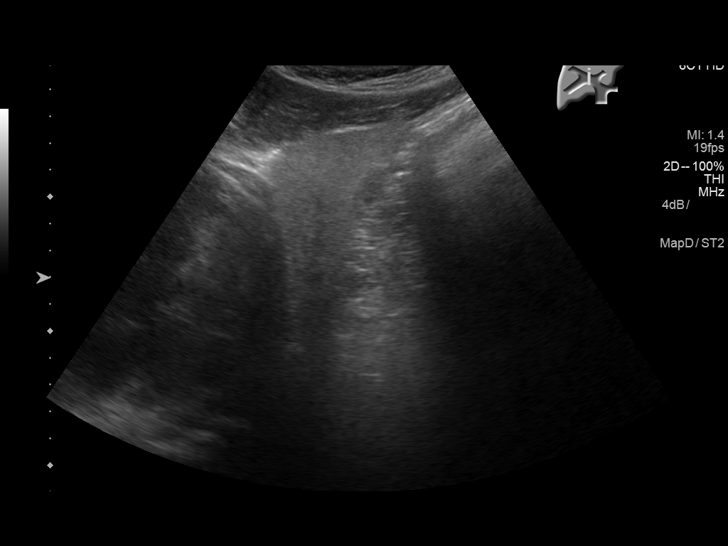
[im 20/52]
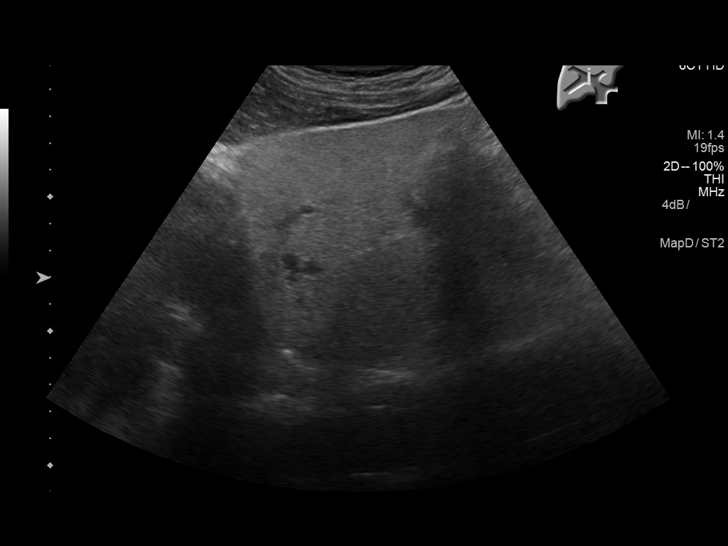
[im 24/52]
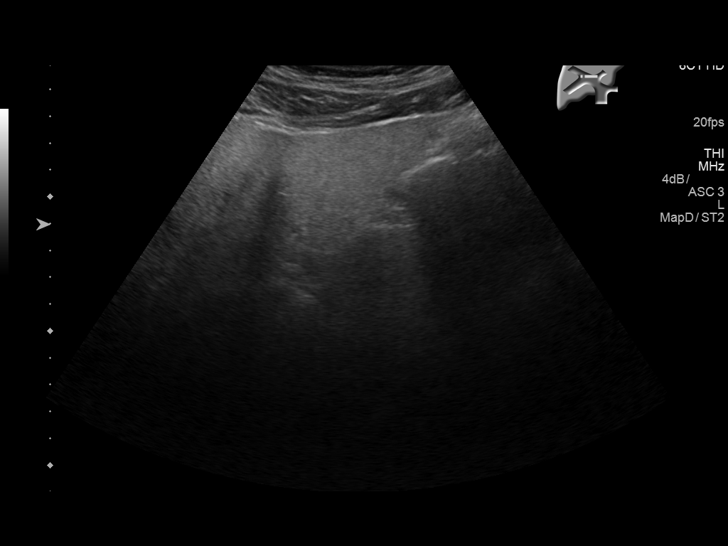
[im 28/52]
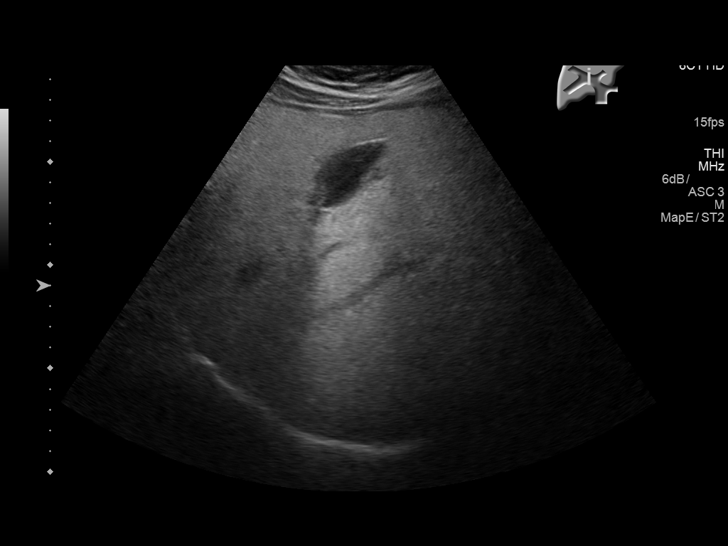
[im 32/52]
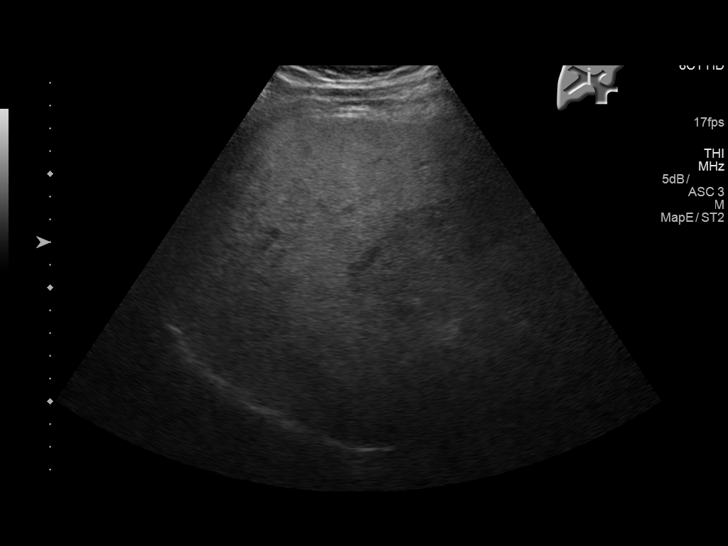
[im 35/52]
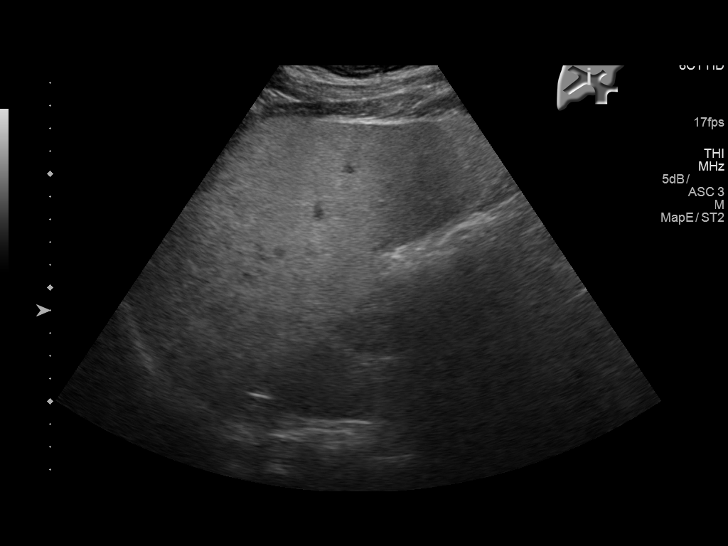
[im 39/52]
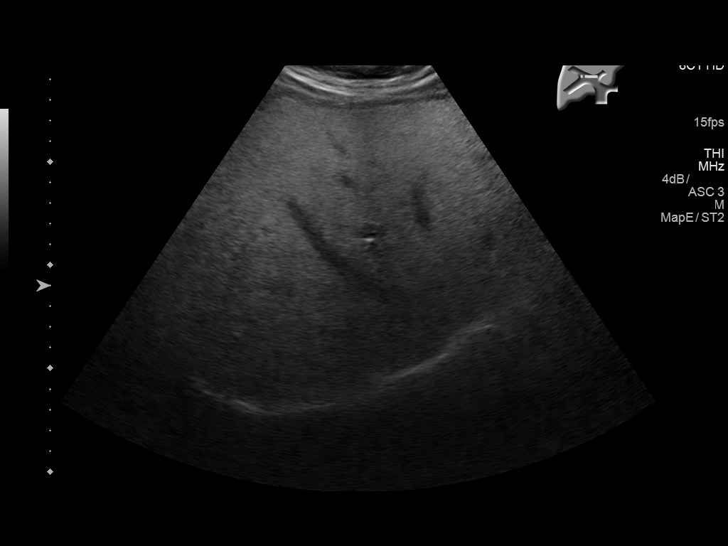
[im 43/52]
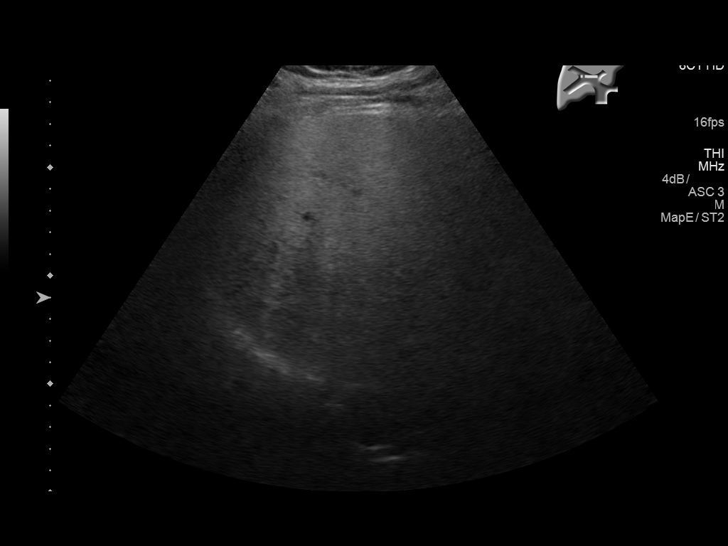
[im 47/52]
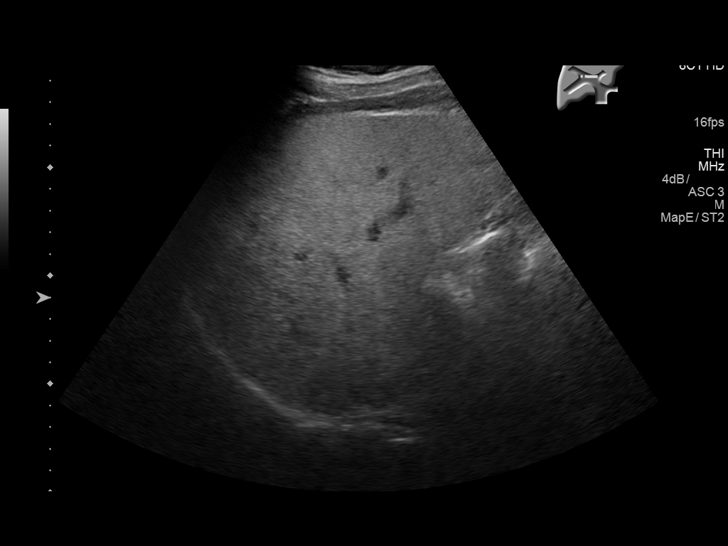
[im 52/52]
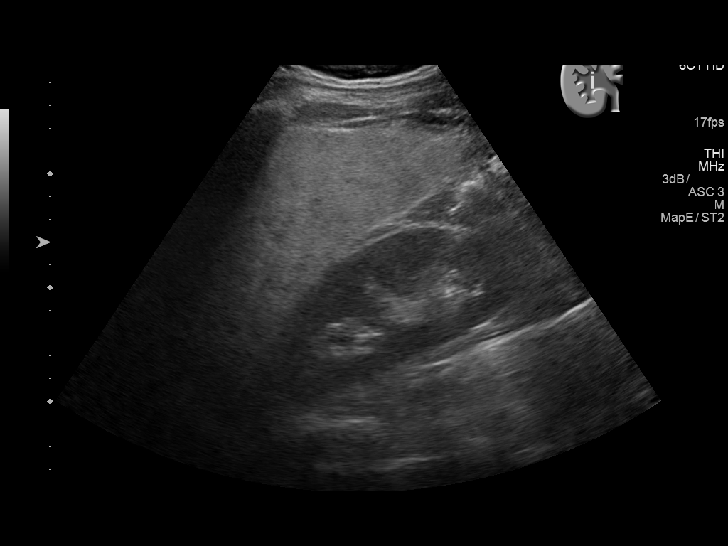

[14 of 25 positions shown; findings below may reference images not displayed]

FINDINGS: Gallbladder:

The gallbladder is adequately distended. There is no evidence of
stones, wall thickening, or pericholecystic fluid. There is no
positive sonographic Murphy's sign.

Common bile duct:

Diameter: 3.5 mm

Liver:

The hepatic echotexture is mildly increased diffusely. There is no
focal mass or ductal dilation.
IMPRESSION: Fatty infiltrative change of the liver. No acute gallbladder or
common bile duct abnormality.
